# Patient Record
Sex: Female | Born: 1975 | Race: Black or African American | Hispanic: No | Marital: Single | Smoking: Never smoker
Health system: Southern US, Community
[De-identification: ages and names within clinical notes are randomized; demographics above are authoritative.]

## PROBLEM LIST (undated history)

## (undated) DIAGNOSIS — F32A Depression, unspecified: Secondary | ICD-10-CM

## (undated) DIAGNOSIS — E669 Obesity, unspecified: Secondary | ICD-10-CM

## (undated) DIAGNOSIS — F329 Major depressive disorder, single episode, unspecified: Secondary | ICD-10-CM

---

## 2017-05-19 ENCOUNTER — Encounter: Payer: Self-pay | Admitting: Radiology

## 2017-05-19 ENCOUNTER — Emergency Department: Payer: 59

## 2017-05-19 ENCOUNTER — Emergency Department
Admission: EM | Admit: 2017-05-19 | Discharge: 2017-05-19 | Disposition: A | Discharge status: Home / Self Care | Payer: 59 | Attending: Emergency Medicine | Admitting: Emergency Medicine

## 2017-05-19 DIAGNOSIS — R110 Nausea: Secondary | ICD-10-CM | POA: Diagnosis not present

## 2017-05-19 DIAGNOSIS — Z79899 Other long term (current) drug therapy: Secondary | ICD-10-CM | POA: Insufficient documentation

## 2017-05-19 DIAGNOSIS — R109 Unspecified abdominal pain: Secondary | ICD-10-CM | POA: Diagnosis present

## 2017-05-19 DIAGNOSIS — N201 Calculus of ureter: Secondary | ICD-10-CM | POA: Diagnosis not present

## 2017-05-19 HISTORY — DX: Major depressive disorder, single episode, unspecified: F32.9

## 2017-05-19 HISTORY — DX: Depression, unspecified: F32.A

## 2017-05-19 HISTORY — DX: Obesity, unspecified: E66.9

## 2017-05-19 LAB — CBC WITH DIFFERENTIAL/PLATELET
Basophils Absolute: 0 10*3/uL (ref 0–0.1)
Basophils Relative: 1 %
EOS ABS: 0.3 10*3/uL (ref 0–0.7)
Eosinophils Relative: 3 %
HEMATOCRIT: 37.7 % (ref 35.0–47.0)
HEMOGLOBIN: 12.8 g/dL (ref 12.0–16.0)
LYMPHS ABS: 2.4 10*3/uL (ref 1.0–3.6)
Lymphocytes Relative: 25 %
MCH: 28.8 pg (ref 26.0–34.0)
MCHC: 33.9 g/dL (ref 32.0–36.0)
MCV: 84.9 fL (ref 80.0–100.0)
MONOS PCT: 7 %
Monocytes Absolute: 0.6 10*3/uL (ref 0.2–0.9)
NEUTROS PCT: 64 %
Neutro Abs: 6.2 10*3/uL (ref 1.4–6.5)
Platelets: 304 10*3/uL (ref 150–440)
RBC: 4.43 MIL/uL (ref 3.80–5.20)
RDW: 14.8 % — ABNORMAL HIGH (ref 11.5–14.5)
WBC: 9.6 10*3/uL (ref 3.6–11.0)

## 2017-05-19 LAB — COMPREHENSIVE METABOLIC PANEL
ALK PHOS: 66 U/L (ref 38–126)
ALT: 30 U/L (ref 14–54)
ANION GAP: 7 (ref 5–15)
AST: 25 U/L (ref 15–41)
Albumin: 4.1 g/dL (ref 3.5–5.0)
BILIRUBIN TOTAL: 0.9 mg/dL (ref 0.3–1.2)
BUN: 14 mg/dL (ref 6–20)
CALCIUM: 9.3 mg/dL (ref 8.9–10.3)
CO2: 24 mmol/L (ref 22–32)
Chloride: 109 mmol/L (ref 101–111)
Creatinine, Ser: 0.73 mg/dL (ref 0.44–1.00)
Glucose, Bld: 108 mg/dL — ABNORMAL HIGH (ref 65–99)
Potassium: 3.2 mmol/L — ABNORMAL LOW (ref 3.5–5.1)
SODIUM: 140 mmol/L (ref 135–145)
TOTAL PROTEIN: 7.8 g/dL (ref 6.5–8.1)

## 2017-05-19 LAB — URINALYSIS, ROUTINE W REFLEX MICROSCOPIC: Specific Gravity, Urine: 1.028 (ref 1.005–1.030)

## 2017-05-19 LAB — POCT PREGNANCY, URINE: Preg Test, Ur: NEGATIVE

## 2017-05-19 LAB — LIPASE, BLOOD: LIPASE: 23 U/L (ref 11–51)

## 2017-05-19 MED ORDER — ONDANSETRON HCL 4 MG/2ML IJ SOLN
4.0000 mg | Freq: Once | INTRAMUSCULAR | Status: AC
  Administered 2017-05-19: 4 mg via INTRAVENOUS
  Filled 2017-05-19: qty 2

## 2017-05-19 MED ORDER — KETOROLAC TROMETHAMINE 30 MG/ML IJ SOLN
30.0000 mg | Freq: Once | INTRAMUSCULAR | Status: AC
  Administered 2017-05-19: 30 mg via INTRAVENOUS
  Filled 2017-05-19: qty 1

## 2017-05-19 MED ORDER — HYDROCODONE-ACETAMINOPHEN 5-325 MG PO TABS: 1.0000 | ORAL_TABLET | Freq: Four times a day (QID) | ORAL | 0 refills | Status: DC | PRN

## 2017-05-19 MED ORDER — TAMSULOSIN HCL 0.4 MG PO CAPS: 0.4000 mg | ORAL_CAPSULE | Freq: Every day | ORAL | 0 refills | Status: DC

## 2017-05-19 MED ORDER — NAPROXEN SODIUM 550 MG PO TABS: 550.0000 mg | ORAL_TABLET | Freq: Two times a day (BID) | ORAL | 0 refills | Status: AC

## 2017-05-19 MED ORDER — IOPAMIDOL (ISOVUE-300) INJECTION 61%
125.0000 mL | Freq: Once | INTRAVENOUS | Status: AC | PRN
  Administered 2017-05-19: 125 mL via INTRAVENOUS

## 2017-05-19 MED ORDER — ONDANSETRON 4 MG PO TBDP: 4.0000 mg | ORAL_TABLET | Freq: Three times a day (TID) | ORAL | 0 refills | Status: AC | PRN

## 2017-05-19 MED ORDER — MORPHINE SULFATE (PF) 4 MG/ML IV SOLN
4.0000 mg | Freq: Once | INTRAVENOUS | Status: AC
  Administered 2017-05-19: 4 mg via INTRAVENOUS
  Filled 2017-05-19: qty 1

## 2017-05-19 MED ORDER — ONDANSETRON HCL 4 MG/2ML IJ SOLN
4.0000 mg | INTRAMUSCULAR | Status: AC
  Administered 2017-05-19: 4 mg via INTRAVENOUS
  Filled 2017-05-19: qty 2

## 2017-05-19 NOTE — ED Notes (Signed)
Pt. States nausea started at around 3 am today.  Pt. States no vomiting.  Pt. States "I feel dehydrated".  Pt. States pain in lower rt. Pelvic area.

## 2017-05-19 NOTE — ED Notes (Signed)
Pillow given to pt.

## 2017-05-19 NOTE — ED Provider Notes (Signed)
Dr. York CeriseForbach asked me to follow up on CT scan on this patient. CT c/w ureterolithiasis. No evidence of infection, normal wbc, afebrile, well appearing. Had relief of pain with toradol. Will d/c with outpt follow up as necessary. Return precautions discussed   Jene EveryKinner, Koby Pickup, MD 05/19/17 [phone]

## 2017-05-19 NOTE — ED Provider Notes (Signed)
[facility] Emergency Department Provider Note  ____________________________________________   First MD Initiated Contact with Patient 05/19/17 [phone]     (approximate)  I have reviewed the triage vital signs and the nursing notes.   HISTORY  Chief Complaint Abdominal Pain    HPI Wendy EpleySara Fitzgerald is a 41 y.o. female with medical history as listed below who presents with acute onset RLQ severe sharp/stabbing abdominal pain.  Reports she has not felt well "for awhile", possibly as long as weeks, with increased urinary frequency but no burning dysuria.  She reports that she went to bed tonight and then awoke a few hours ago with the severe pain.  Worse with movement, slightly better with remaining still.  +Nausea, no vomiting, no diarrhea.  Denies fever/chills, chest pain, SOB.  Reports she finished a course of antibiotics (cannot remember the name).  Patient reports having irregular periods so cannot say for sure when was her LMP, but says she is currently spotting a small amount.   Past Medical History:  Diagnosis Date  . Depression   . Obesity     There are no active problems to display for this patient.   History reviewed. No pertinent surgical history.  Prior to Admission medications   Medication Sig Start Date End Date Taking? Authorizing Provider  topiramate (TOPAMAX) 50 MG tablet Take 50 mg by mouth daily. 05/14/17  Yes [provider]  HYDROcodone-acetaminophen (NORCO/VICODIN) 5-325 MG tablet Take 1 tablet by mouth every 6 (six) hours as needed for moderate pain. 05/19/17   Jene EveryKinner, Robert, MD  naproxen sodium (ANAPROX) 550 MG tablet Take 1 tablet (550 mg total) by mouth 2 (two) times daily with a meal. 05/19/17   Jene EveryKinner, Robert, MD  ondansetron (ZOFRAN ODT) 4 MG disintegrating tablet Take 1 tablet (4 mg total) by mouth every 8 (eight) hours as needed for nausea or vomiting. 05/19/17   Jene EveryKinner, Robert, MD  tamsulosin (FLOMAX) 0.4 MG CAPS capsule Take 1  capsule (0.4 mg total) by mouth daily. 05/19/17   Jene EveryKinner, Robert, MD    Allergies Ambien [zolpidem tartrate]  History reviewed. No pertinent family history.  Social History Social History  Substance Use Topics  . Smoking status: Never Smoker  . Smokeless tobacco: Never Used  . Alcohol use No    Review of Systems Constitutional: No fever/chills Eyes: No visual changes. ENT: No sore throat. Cardiovascular: Denies chest pain. Respiratory: Denies shortness of breath. Gastrointestinal: acute onset severe abdominal pain in the RLQ with nausea, no vomiting, no diarrhea Genitourinary: Negative for dysuria. Musculoskeletal: Negative for neck pain.  Negative for back pain. Integumentary: Negative for rash. Neurological: Negative for headaches, focal weakness or numbness.   ____________________________________________   PHYSICAL EXAM:  VITAL SIGNS: ED Triage Vitals [05/19/17 0553]  Enc Vitals Group     BP (!) 156/91     Pulse Rate 73     Resp 20     Temp (!) 97.5 F (36.4 C)     Temp Source Oral     SpO2 94 %     Weight (!) 142.4 kg (314 lb)     Height 1.575 m (5\' 2" )     Head Circumference      Peak Flow      Pain Score 8     Pain Loc      Pain Edu?      Excl. in GC?     Constitutional: Alert and oriented. Appears to be in severe pain Eyes: Conjunctivae are  normal.  Head: Atraumatic. Nose: No congestion/rhinnorhea. Mouth/Throat: Mucous membranes are moist. Neck: No stridor.  No meningeal signs.   Cardiovascular: Normal rate, regular rhythm. Good peripheral circulation. Grossly normal heart sounds. Respiratory: Normal respiratory effort.  No retractions. Lungs CTAB. Gastrointestinal: Obese.  Soft with diffuse tenderness throughout but more focal tenderness in the RLQ.  No rebound/guarding Musculoskeletal: No lower extremity tenderness nor edema. No gross deformities of extremities. Neurologic:  Normal speech and language. No gross focal neurologic deficits are  appreciated.  Skin:  Skin is warm, dry and intact. No rash noted.   ____________________________________________   LABS (all labs ordered are listed, but only abnormal results are displayed)  Labs Reviewed  URINALYSIS, ROUTINE W REFLEX MICROSCOPIC - Abnormal; Notable for the following:       Result Value   Color, Urine ORANGE (*)    APPearance CLOUDY (*)    Glucose, UA   (*)    Value: TEST NOT REPORTED DUE TO COLOR INTERFERENCE OF URINE PIGMENT   Hgb urine dipstick   (*)    Value: TEST NOT REPORTED DUE TO COLOR INTERFERENCE OF URINE PIGMENT   Bilirubin Urine   (*)    Value: TEST NOT REPORTED DUE TO COLOR INTERFERENCE OF URINE PIGMENT   Ketones, ur   (*)    Value: TEST NOT REPORTED DUE TO COLOR INTERFERENCE OF URINE PIGMENT   Protein, ur   (*)    Value: TEST NOT REPORTED DUE TO COLOR INTERFERENCE OF URINE PIGMENT   Nitrite   (*)    Value: TEST NOT REPORTED DUE TO COLOR INTERFERENCE OF URINE PIGMENT   Leukocytes, UA   (*)    Value: TEST NOT REPORTED DUE TO COLOR INTERFERENCE OF URINE PIGMENT   Bacteria, UA RARE (*)    Squamous Epithelial / LPF 0-5 (*)    All other components within normal limits  CBC WITH DIFFERENTIAL/PLATELET - Abnormal; Notable for the following:    RDW 14.8 (*)    All other components within normal limits  COMPREHENSIVE METABOLIC PANEL - Abnormal; Notable for the following:    Potassium 3.2 (*)    Glucose, Bld 108 (*)    All other components within normal limits  LIPASE, BLOOD  POC URINE PREG, ED  POCT PREGNANCY, URINE   ____________________________________________  EKG  EKG not ordered by ED physician ____________________________________________  RADIOLOGY  CT abd/pelvis w/ IV contrast is pending   ____________________________________________   PROCEDURES  Critical Care performed: No   Procedure(s) performed:   Procedures   ____________________________________________   INITIAL IMPRESSION / ASSESSMENT AND PLAN / ED  COURSE  Pertinent labs & imaging results that were available during my care of the patient were reviewed by me and considered in my medical decision making (see chart for details).  Patient in acute onset severe pain in RLQ w/ nausea.  Ovarian torsion is possible, but the overall impression is more one of renal colic.  History is limited by the patient's current severe pain.  Will give morphine and Zofran and obtain CT abd/pelvis w/ IV contrast to best evaluate for any acute intraabdominal pathology including appendicitis, diverticulitis, renal colic, adnexal abnormalities, etc.  If CT unremarkable, patient may benefit from transvaginal ultrasound.   Clinical Course as of May 19 841  Wynelle Link May 19, 2017  1610 transferring ED care to Dr. Cyril Loosen to follow-up on the CT scan, reassess, and disposition appropriately  [CF]    Clinical Course User Index [CF] Loleta Rose, MD  ____________________________________________  FINAL CLINICAL IMPRESSION(S) / ED DIAGNOSES  RLQ abdominal pain Nausea   MEDICATIONS GIVEN DURING THIS VISIT:  Medications  morphine 4 MG/ML injection 4 mg (4 mg Intravenous Given 05/19/17 0641)  ondansetron (ZOFRAN) injection 4 mg (4 mg Intravenous Given 05/19/17 0641)  iopamidol (ISOVUE-300) 61 % injection 125 mL (125 mLs Intravenous Contrast Given 05/19/17 0720)       Note:  This document was prepared using Dragon voice recognition software and may include unintentional dictation errors.    Loleta Rose, MD 05/19/17 [phone]

## 2017-05-19 NOTE — ED Notes (Signed)
Patient transported to CT 

## 2017-05-19 NOTE — ED Notes (Signed)
Pt. States she finished antibiotics for UTI about a week ago.  Pt. States she feels urgency but unable to provide urine at this time.

## 2017-05-19 NOTE — ED Triage Notes (Signed)
Patient reports abdominal pain for "a few hours".  Reports recently treated for UTI, but symptoms never totally went away even after finished course.

## 2017-09-05 DIAGNOSIS — F329 Major depressive disorder, single episode, unspecified: Secondary | ICD-10-CM | POA: Insufficient documentation

## 2017-09-05 DIAGNOSIS — F319 Bipolar disorder, unspecified: Secondary | ICD-10-CM | POA: Insufficient documentation

## 2017-09-06 DIAGNOSIS — F603 Borderline personality disorder: Secondary | ICD-10-CM | POA: Insufficient documentation

## 2017-09-06 DIAGNOSIS — F4312 Post-traumatic stress disorder, chronic: Secondary | ICD-10-CM | POA: Insufficient documentation

## 2017-09-06 DIAGNOSIS — F332 Major depressive disorder, recurrent severe without psychotic features: Secondary | ICD-10-CM | POA: Insufficient documentation

## 2019-04-10 IMAGING — CT CT ABD-PELV W/ CM
2 of 4 series · 16 of 46 positions shown, 18 images · IV contrast (APPLIED)
Comparison: None.

CLINICAL DATA: 40-year-old presenting with acute onset of right
lower quadrant abdominal pain that began earlier this morning,
associated with nausea and vomiting. Patient states that she has had
a recent urinary tract infection.

EXAM:
CT ABDOMEN AND PELVIS WITH CONTRAST
TECHNIQUE: Multidetector CT imaging of the abdomen and pelvis was performed
using the standard protocol following bolus administration of
intravenous contrast.
CONTRAST:  125mL [id number] IOPAMIDOL INJECTION 61% IV.

[Series 2: routine abd/pel with · axial · 0.98mm/px · z∈[-1028,-598]mm · 13 of 94 slices shown, 15 images]
[im 4/94  soft-tissue]
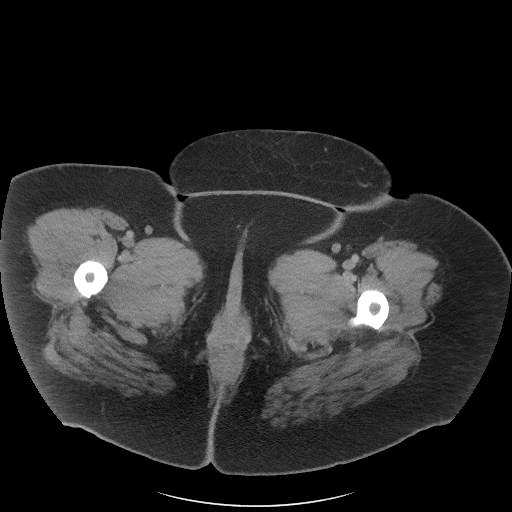
[im 4/94  bone]
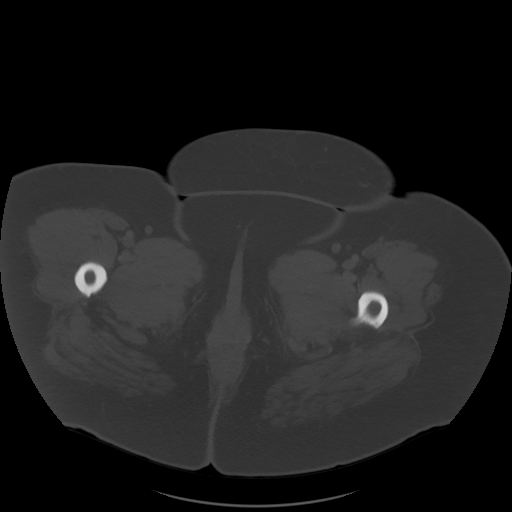
[im 12/94  soft-tissue]
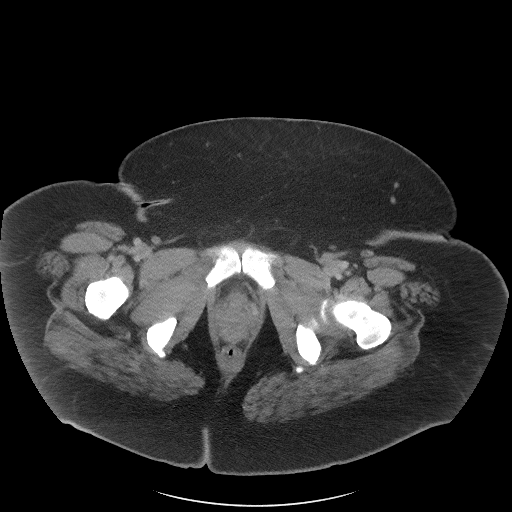
[im 19/94  soft-tissue]
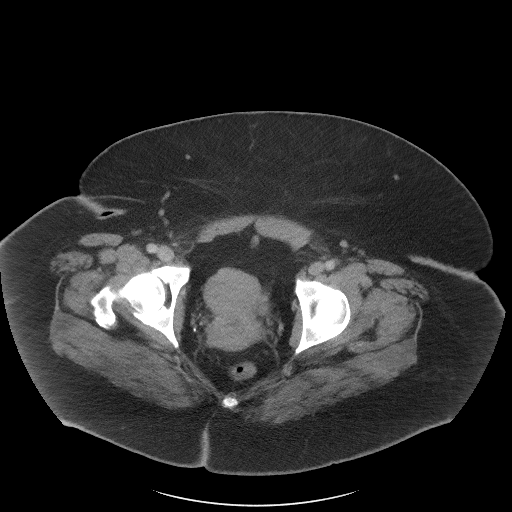
[im 27/94  soft-tissue]
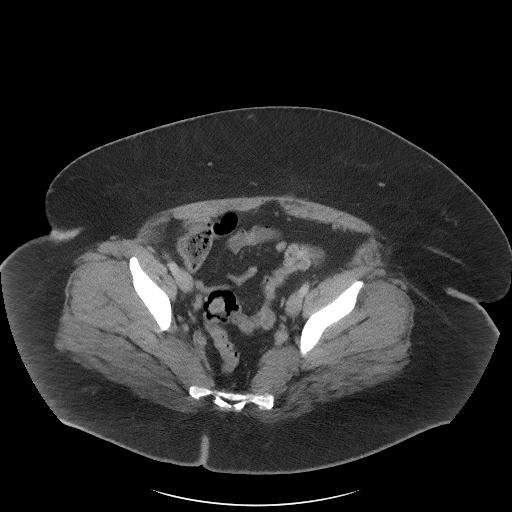
[im 34/94  soft-tissue]
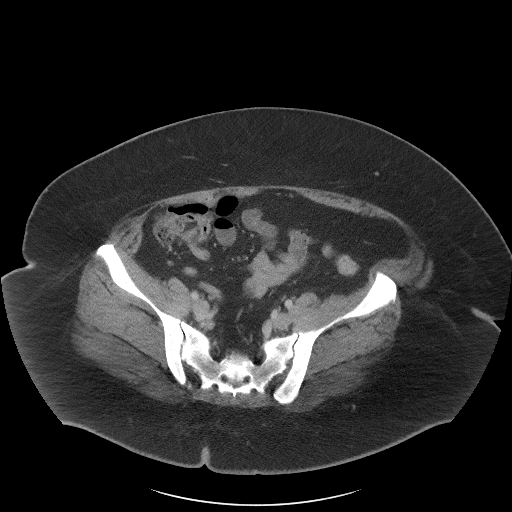
[im 41/94  soft-tissue]
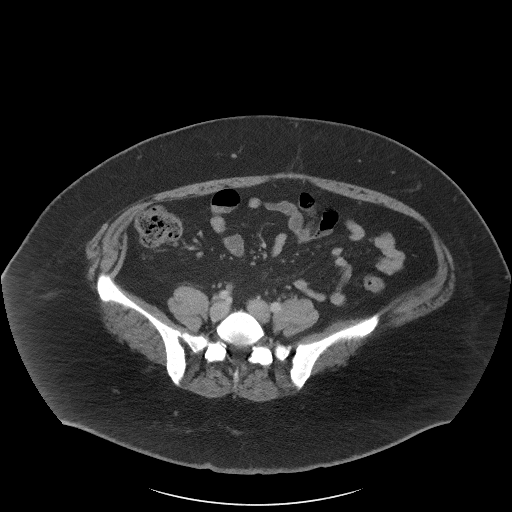
[im 49/94  soft-tissue]
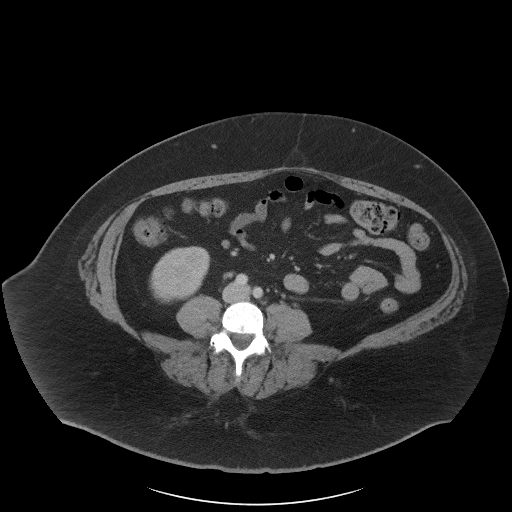
[im 53/94  soft-tissue]
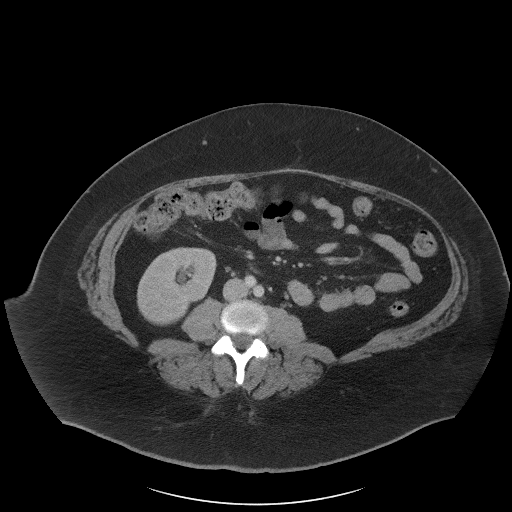
[im 60/94  soft-tissue]
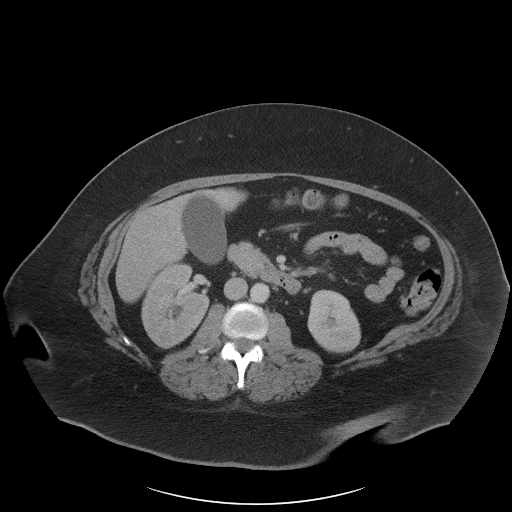
[im 60/94  bone]
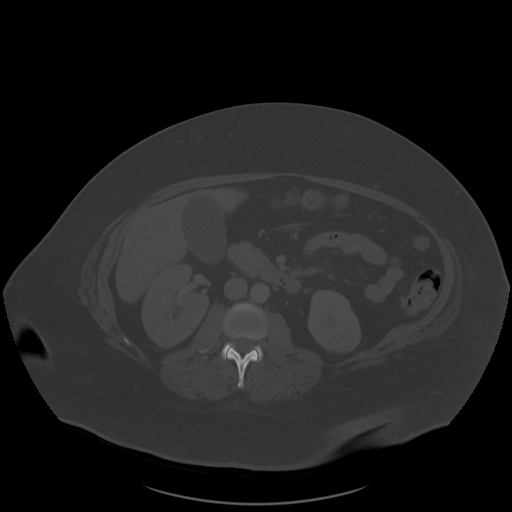
[im 67/94  soft-tissue]
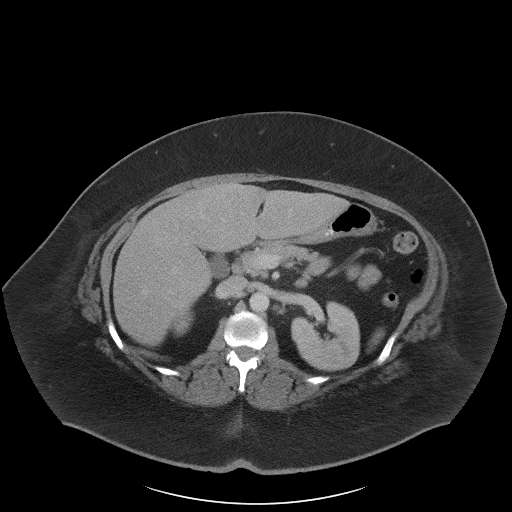
[im 75/94  soft-tissue]
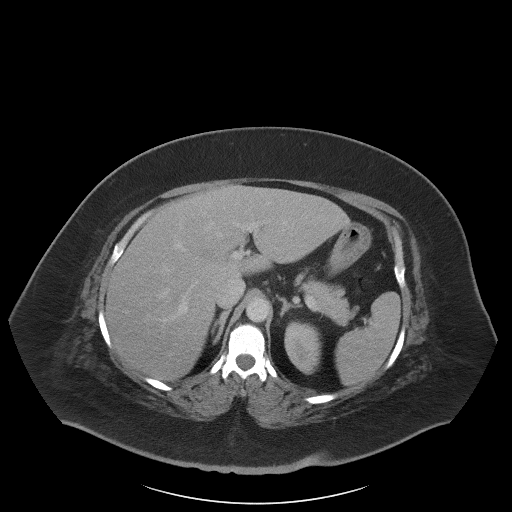
[im 82/94  soft-tissue]
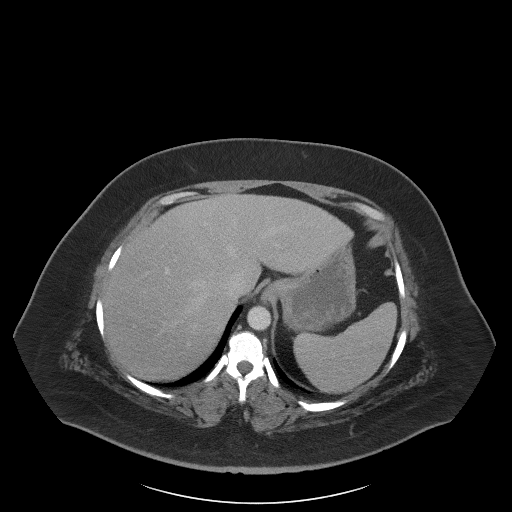
[im 90/94  soft-tissue]
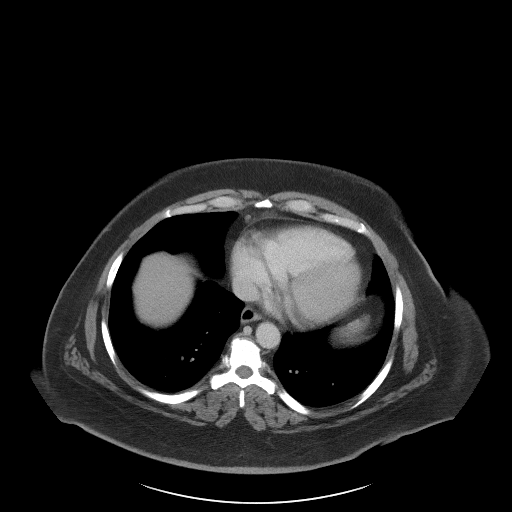

[Series 5: coronal st · coronal · 0.92mm/px · 3 of 113 slices shown]
[im 38/113  soft-tissue]
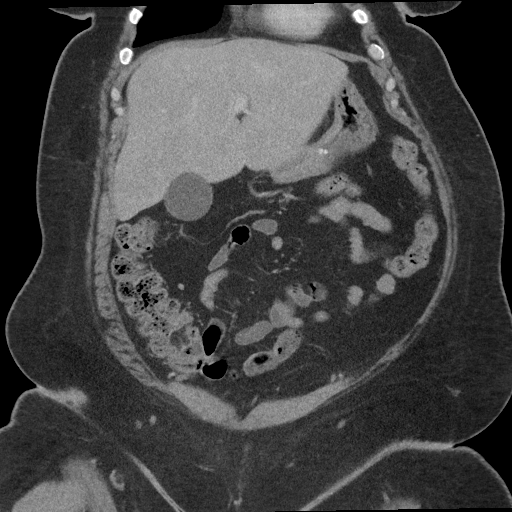
[im 50/113  soft-tissue]
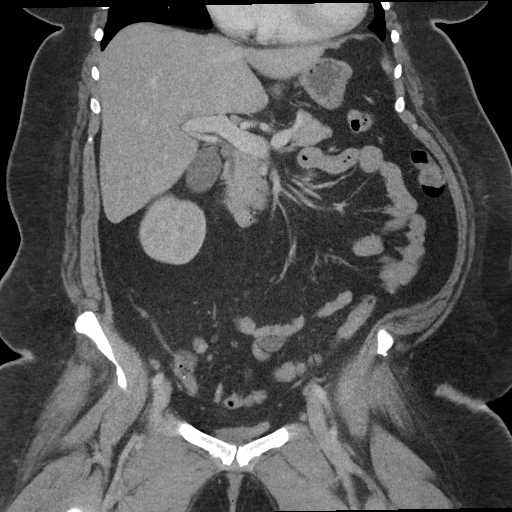
[im 63/113  soft-tissue]
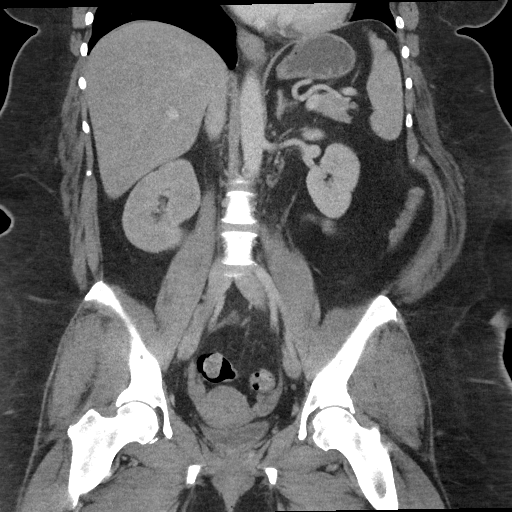

[16 of 46 positions shown; findings below may reference images not displayed]

FINDINGS: Lower chest: Heart size normal.  Visualized lung bases clear.

Hepatobiliary: Liver normal in size and appearance. Gallbladder
normal in appearance without calcified gallstones. No biliary ductal
dilation.

Pancreas: Normal in appearance without evidence of mass, ductal
dilation, or inflammation.

Spleen: Normal in size and appearance.

Adrenals/Urinary Tract: Normal appearing adrenal glands. Obstructing
4 mm calculus at the right ureterovesical junction causing mild
right hydronephrosis and mild right renal edema. Nonobstructing 4 mm
calculus in a lower pole calyx of the left kidney. No focal
parenchymal abnormalities involving either kidney. Urinary bladder
decompressed and unremarkable.

Stomach/Bowel: Stomach normal in appearance for the degree of
distention. Normal-appearing small bowel. Normal-appearing colon
with expected stool burden. Normal-appearing appendix without
evidence of inflammation.

Vascular/Lymphatic: No visible aortoiliofemoral atherosclerosis.
Widely patent visceral arteries. Normal-appearing portal venous and
systemic venous systems.

No pathologic lymphadenopathy.

Reproductive: Normal-appearing uterus and ovaries without evidence
of adnexal mass.

Other: Small umbilical hernia containing fat.

Musculoskeletal: Degenerative disc disease at L4-5. Facet
degenerative changes at L4-5 and L5-S1.
IMPRESSION: 1. Obstructing approximate 4 mm calculus at the right UVJ.
2. Nonobstructing 4 mm calculus in a lower pole calyx of the left
kidney.
3. Small umbilical hernia containing fat.

## 2019-11-28 ENCOUNTER — Ambulatory Visit: Payer: Self-pay

## 2021-04-12 ENCOUNTER — Ambulatory Visit: Payer: Managed Care, Other (non HMO) | Admitting: Internal Medicine

## 2021-04-12 NOTE — Progress Notes (Signed)
No show for appointment. Office will call to reschedule.  

## 2021-05-16 ENCOUNTER — Other Ambulatory Visit: Payer: Self-pay

## 2021-05-16 ENCOUNTER — Ambulatory Visit (INDEPENDENT_AMBULATORY_CARE_PROVIDER_SITE_OTHER): Payer: Managed Care, Other (non HMO) | Admitting: Internal Medicine

## 2021-05-16 VITALS — BP 174/100 | HR 76 | Resp 18 | Ht 63.0 in | Wt >= 6400 oz

## 2021-05-16 DIAGNOSIS — G4733 Obstructive sleep apnea (adult) (pediatric): Secondary | ICD-10-CM | POA: Diagnosis not present

## 2021-05-16 DIAGNOSIS — F319 Bipolar disorder, unspecified: Secondary | ICD-10-CM | POA: Diagnosis not present

## 2021-05-16 DIAGNOSIS — F4312 Post-traumatic stress disorder, chronic: Secondary | ICD-10-CM

## 2021-05-16 DIAGNOSIS — R030 Elevated blood-pressure reading, without diagnosis of hypertension: Secondary | ICD-10-CM | POA: Diagnosis not present

## 2021-05-16 DIAGNOSIS — Z7189 Other specified counseling: Secondary | ICD-10-CM | POA: Diagnosis not present

## 2021-05-16 DIAGNOSIS — F603 Borderline personality disorder: Secondary | ICD-10-CM

## 2021-05-16 DIAGNOSIS — Z6841 Body Mass Index (BMI) 40.0 and over, adult: Secondary | ICD-10-CM

## 2021-05-16 NOTE — Progress Notes (Signed)
[facility] [address] [place], [place] [address]  Pulmonary Sleep Medicine   Office Visit Note  Patient Name: Wendy Fitzgerald DOB: Feb 07, 1976 MRN [id number]    Chief Complaint: Obstructive Sleep Apnea visit  Brief History:  Wendy Fitzgerald is seen today for initial consult for BIPAP to get supplies. The patient has a 10 years history of sleep apnea. Patient is using PAP nightly.  The patient feels rested after sleeping with PAP.  The patient reports benefiting from PAP use. Reported sleepiness is  improved and the Epworth Sleepiness Score is 7 out of 24. The patient rarely take naps. Patient goes to sleep at 10-10:30pm and wakes at 8-9am. Patient drinks coffee to help her stay awake. Patient reports with she snores and gasp for air when she doesn't have mask on with BIPAP. The patient complains of the following: none.  The compliance download shows 100% compliance with an average use time of 8.5 hours. The AHI is 0.2  The patient does not complain of limb movements disrupting sleep. Patient will work on obtaining copies of sleep studies to place on file.  ROS  General: (-) fever, (-) chills, (-) night sweat Nose and Sinuses: (-) nasal stuffiness or itchiness, (-) postnasal drip, (-) nosebleeds, (-) sinus trouble. Mouth and Throat: (-) sore throat, (-) hoarseness. Neck: (-) swollen glands, (-) enlarged thyroid, (-) neck pain. Respiratory: - cough, - shortness of breath, - wheezing. Neurologic: - numbness, - tingling. Psychiatric: - anxiety, - depression   Current Medication: Outpatient Encounter Medications as of 05/16/2021  Medication Sig   albuterol (VENTOLIN HFA) 108 (90 Base) MCG/ACT inhaler Inhale into the lungs.   LATUDA 40 MG TABS tablet Take 40 mg by mouth at bedtime.   melatonin 5 MG TABS Take by mouth.   metFORMIN (GLUCOPHAGE-XR) 750 MG 24 hr tablet Take 750 mg by mouth daily.   naproxen sodium (ANAPROX) 550 MG tablet Take 1 tablet (550 mg total) by mouth 2 (two) times  daily with a meal.   ondansetron (ZOFRAN ODT) 4 MG disintegrating tablet Take 1 tablet (4 mg total) by mouth every 8 (eight) hours as needed for nausea or vomiting.   [DISCONTINUED] HYDROcodone-acetaminophen (NORCO/VICODIN) 5-325 MG tablet Take 1 tablet by mouth every 6 (six) hours as needed for moderate pain.   [DISCONTINUED] tamsulosin (FLOMAX) 0.4 MG CAPS capsule Take 1 capsule (0.4 mg total) by mouth daily.   [DISCONTINUED] topiramate (TOPAMAX) 50 MG tablet Take 50 mg by mouth daily.   No facility-administered encounter medications on file as of 05/16/2021.    Surgical History: History reviewed. No pertinent surgical history.  Medical History: Past Medical History:  Diagnosis Date   Depression    Obesity     Family History: Non contributory to the present illness  Social History: Social History   Socioeconomic History   Marital status: Single    Spouse name: Not on file   Number of children: Not on file   Years of education: Not on file   Highest education level: Not on file  Occupational History   Not on file  Tobacco Use   Smoking status: Never   Smokeless tobacco: Never  Substance and Sexual Activity   Alcohol use: No   Drug use: No   Sexual activity: Never  Other Topics Concern   Not on file  Social History Narrative   Not on file   Social Determinants of Health   Financial Resource Strain: Not on file  Food Insecurity: Not on file  Transportation Needs: Not on file  Physical Activity: Not on file  Stress: Not on file  Social Connections: Not on file  Intimate Partner Violence: Not on file    Vital Signs: Blood pressure (!) 174/100, pulse 76, resp. rate 18, height 5\' 3"  (1.6 m), weight (!) 409 lb (185.5 kg), SpO2 97 %.  Examination: General Appearance: The patient is well-developed, well-nourished, and in no distress. Neck Circumference: 52 cm Skin: Gross inspection of skin unremarkable. Head: normocephalic, no gross deformities. Eyes: no gross  deformities noted. ENT: ears appear grossly normal Neurologic: Alert and oriented. No involuntary movements.    EPWORTH SLEEPINESS SCALE:  Scale:  (0)= no chance of dozing; (1)= slight chance of dozing; (2)= moderate chance of dozing; (3)= high chance of dozing  Chance  Situtation    Sitting and reading: 1    Watching TV: 2    Sitting Inactive in public: 0    As a passenger in car: 1      Lying down to rest: 3    Sitting and talking: 0    Sitting quielty after lunch: 0    In a car, stopped in traffic: 0   TOTAL SCORE:   7 out of 24    SLEEP STUDIES:  none   CPAP COMPLIANCE DATA:  Date Range: 05/17/20-05/16/21  Average Daily Use: 8.5 hours  Median Use: 8.5  Compliance for > 4 Hours: 100%   AHI: 0.2 respiratory events per hour  Days Used: 364/365  Mask Leak: 11.0  95th Percentile Pressure: BIPAP         LABS: No results found for this or any previous visit (from the past 2160 hour(s)).  Radiology: CT Abdomen Pelvis W Contrast  Result Date: 05/19/2017 CLINICAL DATA:  45 year old presenting with acute onset of right lower quadrant abdominal pain that began earlier this morning, associated with nausea and vomiting. Patient states that she has had a recent urinary tract infection. EXAM: CT ABDOMEN AND PELVIS WITH CONTRAST TECHNIQUE: Multidetector CT imaging of the abdomen and pelvis was performed using the standard protocol following bolus administration of intravenous contrast. CONTRAST:  24 ISOVUE-300 IOPAMIDOL INJECTION 61% IV. COMPARISON:  None. FINDINGS: Lower chest: Heart size normal.  Visualized lung bases clear. Hepatobiliary: Liver normal in size and appearance. Gallbladder normal in appearance without calcified gallstones. No biliary ductal dilation. Pancreas: Normal in appearance without evidence of mass, ductal dilation, or inflammation. Spleen: Normal in size and appearance. Adrenals/Urinary Tract: Normal appearing adrenal glands.  Obstructing 4 mm calculus at the right ureterovesical junction causing mild right hydronephrosis and mild right renal edema. Nonobstructing 4 mm calculus in a lower pole calyx of the left kidney. No focal parenchymal abnormalities involving either kidney. Urinary bladder decompressed and unremarkable. Stomach/Bowel: Stomach normal in appearance for the degree of distention. Normal-appearing small bowel. Normal-appearing colon with expected stool burden. Normal-appearing appendix without evidence of inflammation. Vascular/Lymphatic: No visible aortoiliofemoral atherosclerosis. Widely patent visceral arteries. Normal-appearing portal venous and systemic venous systems. No pathologic lymphadenopathy. Reproductive: Normal-appearing uterus and ovaries without evidence of adnexal mass. Other: Small umbilical hernia containing fat. Musculoskeletal: Degenerative disc disease at L4-5. Facet degenerative changes at L4-5 and L5-S1. IMPRESSION: 1. Obstructing approximate 4 mm calculus at the right UVJ. 2. Nonobstructing 4 mm calculus in a lower pole calyx of the left kidney. 3. Small umbilical hernia containing fat. Electronically Signed   By: M.D.   On: 05/19/2017 07:39    No results found.  No results found.  Assessment and Plan: Patient Active Problem List   Diagnosis Date Noted   Borderline personality disorder (HCC) 09/06/2017   Chronic post-traumatic stress disorder (PTSD) 09/06/2017   Severe episode of recurrent major depressive disorder, without psychotic features (HCC) 09/06/2017   Bipolar 1 disorder (HCC) 09/05/2017   Major depressive disorder 09/05/2017      The patient does tolerate PAP and reports benefit from PAP use. The patient was reminded how to adjust mask fit and advised to change supplies regularly. The patient was also counselled on nightly use. The compliance is excellent. The AHI is 0.2.   1. OSA (obstructive sleep apnea) Continue excellent compliance  2.  CPAP use counseling CPAP couseling-Discussed importance of adequate CPAP use as well as proper care and cleaning techniques of machine and all supplies.  3. Elevated BP without diagnosis of hypertension Elevated in office, per pt normally well controlled and may be due to poor fit with cuff. Patient will monitor and f/u with PCP.  4. Bipolar 1 disorder (HCC) Followed by psych  5. Chronic post-traumatic stress disorder (PTSD) Followed by psych  6. Borderline personality disorder (HCC) Followed by psych  7. Morbid obesity with BMI of 70 and over, adult [facility]) Obesity Counseling: Had a lengthy discussion regarding patients BMI and weight issues. Patient was instructed on portion control as well as increased activity. Also discussed caloric restrictions with trying to maintain intake less than 2000 Kcal. Discussions were made in accordance with the 5As of weight management. Simple actions such as not eating late and if able to, taking a walk is suggested.    General Counseling: I have discussed the findings of the evaluation and examination with Huntley Dec.  I have also discussed any further diagnostic evaluation thatmay be needed or ordered today. Shawnice verbalizes understanding of the findings of todays visit. We also reviewed her medications today and discussed drug interactions and side effects including but not limited excessive drowsiness and altered mental states. We also discussed that there is always a risk not just to her but also people around her. she has been encouraged to call the office with any questions or concerns that should arise related to todays visit.  No orders of the defined types were placed in this encounter.       I have personally obtained a history, examined the patient, evaluated laboratory and imaging results, formulated the assessment and plan and placed orders.  This patient was seen by Lynn Ito, PA-C in collaboration with Dr. Freda Munro as a part of  collaborative care agreement.   Valentino Hue Sol Blazing, PhD, FAASM  Diplomate, American Board of Sleep Medicine    Yevonne Pax, MD [facility] Diplomate ABMS Pulmonary and Critical Care Medicine Sleep medicine

## 2021-05-16 NOTE — Patient Instructions (Signed)
